# Patient Record
Sex: Male | Born: 1965 | Race: White | Hispanic: No | Marital: Married | Smoking: Current every day smoker
Health system: Southern US, Community
[De-identification: ages and names within clinical notes are randomized; demographics above are authoritative.]

## PROBLEM LIST (undated history)

## (undated) DIAGNOSIS — R319 Hematuria, unspecified: Secondary | ICD-10-CM

## (undated) DIAGNOSIS — Z8249 Family history of ischemic heart disease and other diseases of the circulatory system: Secondary | ICD-10-CM

## (undated) DIAGNOSIS — Z833 Family history of diabetes mellitus: Secondary | ICD-10-CM

## (undated) DIAGNOSIS — R030 Elevated blood-pressure reading, without diagnosis of hypertension: Secondary | ICD-10-CM

## (undated) DIAGNOSIS — Z720 Tobacco use: Secondary | ICD-10-CM

## (undated) DIAGNOSIS — L0591 Pilonidal cyst without abscess: Secondary | ICD-10-CM

## (undated) HISTORY — DX: Family history of diabetes mellitus: Z83.3

## (undated) HISTORY — DX: Hematuria, unspecified: R31.9

## (undated) HISTORY — DX: Family history of ischemic heart disease and other diseases of the circulatory system: Z82.49

## (undated) HISTORY — PX: TONSILLECTOMY AND ADENOIDECTOMY: SHX28

## (undated) HISTORY — DX: Tobacco use: Z72.0

## (undated) HISTORY — DX: Elevated blood-pressure reading, without diagnosis of hypertension: R03.0

---

## 2008-03-11 ENCOUNTER — Emergency Department (HOSPITAL_COMMUNITY): Admission: EM | Admit: 2008-03-11 | Discharge: 2008-03-11 | Payer: Self-pay | Admitting: Emergency Medicine

## 2008-11-02 ENCOUNTER — Emergency Department (HOSPITAL_COMMUNITY): Admission: EM | Admit: 2008-11-02 | Discharge: 2008-11-02 | Payer: Self-pay | Admitting: Emergency Medicine

## 2009-02-24 ENCOUNTER — Emergency Department (HOSPITAL_COMMUNITY): Admission: EM | Admit: 2009-02-24 | Discharge: 2009-02-24 | Payer: Self-pay | Admitting: Emergency Medicine

## 2009-05-24 ENCOUNTER — Emergency Department (HOSPITAL_COMMUNITY): Admission: EM | Admit: 2009-05-24 | Discharge: 2009-05-24 | Payer: Self-pay | Admitting: Family Medicine

## 2010-06-08 ENCOUNTER — Emergency Department (HOSPITAL_COMMUNITY): Admission: EM | Admit: 2010-06-08 | Discharge: 2010-06-08 | Payer: Self-pay | Admitting: Family Medicine

## 2012-01-09 ENCOUNTER — Encounter (HOSPITAL_COMMUNITY): Payer: Self-pay | Admitting: *Deleted

## 2012-01-09 ENCOUNTER — Emergency Department (INDEPENDENT_AMBULATORY_CARE_PROVIDER_SITE_OTHER)
Admission: EM | Admit: 2012-01-09 | Discharge: 2012-01-09 | Disposition: A | Discharge status: Home / Self Care | Payer: PRIVATE HEALTH INSURANCE | Source: Home / Self Care | Attending: Family Medicine | Admitting: Family Medicine

## 2012-01-09 DIAGNOSIS — K047 Periapical abscess without sinus: Secondary | ICD-10-CM

## 2012-01-09 MED ORDER — IBUPROFEN 600 MG PO TABS: 600.0000 mg | ORAL_TABLET | Freq: Three times a day (TID) | ORAL | Status: AC | PRN

## 2012-01-09 MED ORDER — CEFTRIAXONE SODIUM 1 G IJ SOLR
INTRAMUSCULAR | Status: AC
  Filled 2012-01-09: qty 10

## 2012-01-09 MED ORDER — CLINDAMYCIN HCL 300 MG PO CAPS: 300.0000 mg | ORAL_CAPSULE | Freq: Three times a day (TID) | ORAL | Status: AC

## 2012-01-09 MED ORDER — LIDOCAINE HCL (PF) 1 % IJ SOLN
INTRAMUSCULAR | Status: AC
  Filled 2012-01-09: qty 5

## 2012-01-09 MED ORDER — CEFTRIAXONE SODIUM 1 G IJ SOLR
1.0000 g | Freq: Once | INTRAMUSCULAR | Status: AC
  Administered 2012-01-09: 1 g via INTRAMUSCULAR

## 2012-01-09 MED ORDER — HYDROCODONE-ACETAMINOPHEN 5-500 MG PO TABS: 1.0000 | ORAL_TABLET | Freq: Four times a day (QID) | ORAL | Status: AC | PRN

## 2012-01-09 NOTE — ED Notes (Signed)
No adverse reaction to antibiotic injection

## 2012-01-09 NOTE — ED Provider Notes (Signed)
History     CSN: [id number]  Arrival date & time 01/09/12  0808   First MD Initiated Contact with Patient 01/09/12 0815      Chief Complaint  Patient presents with  . Dental Pain  . Facial Swelling  . Otalgia    (Consider location/radiation/quality/duration/timing/severity/associated sxs/prior treatment) HPI Comments: 46 y/o male no significant PMH here c/o dental pain right upper jaw associated with right lower face swelling since yesterday. Has a recent cold that is resolving still some persistent nasal congestion. Has had intermittent right ear pain  No drainage. No sore throat, cough or chest pain. Just got health insurance no dental insurance.    History reviewed. No pertinent past medical history.  History reviewed. No pertinent past surgical history.  Family History  Problem Relation Age of Onset  . Diabetes Mother   . Heart failure Mother     History  Substance Use Topics  . Smoking status: Current Everyday Smoker  . Smokeless tobacco: Not on file  . Alcohol Use: Yes      Review of Systems  Constitutional: Negative for fever, chills and appetite change.  HENT: Positive for ear pain, congestion, facial swelling and dental problem. Negative for neck stiffness and ear discharge.   Respiratory: Negative for cough and shortness of breath.   Gastrointestinal: Negative for nausea and vomiting.    Allergies  Review of patient's allergies indicates no known allergies.  Home Medications   Current Outpatient Rx  Name Route Sig Dispense Refill  . ADVIL PM PO Oral Take by mouth.    Marland Kitchen NAPROXEN SODIUM 220 MG PO TABS Oral Take 220 mg by mouth 2 (two) times daily with a meal.    . CLINDAMYCIN HCL 300 MG PO CAPS Oral Take 1 capsule (300 mg total) by mouth 3 (three) times daily. 21 capsule 0  . HYDROCODONE-ACETAMINOPHEN 5-500 MG PO TABS Oral Take 1-2 tablets by mouth every 6 (six) hours as needed for pain. 15 tablet 0  . IBUPROFEN 600 MG PO TABS Oral Take 1 tablet (600  mg total) by mouth every 8 (eight) hours as needed for pain. 20 tablet 0    BP 142/89  Pulse 97  Temp(Src) 98.6 F (37 C) (Oral)  Resp 18  SpO2 97%  Physical Exam  Nursing note and vitals reviewed. Constitutional: He is oriented to person, place, and time. He appears well-developed and well-nourished. No distress.  HENT:  Head: Normocephalic and atraumatic.  Right Ear: External ear normal.  Left Ear: External ear normal.  Mouth/Throat: Oropharynx is clear and moist. No oropharyngeal exudate.       Nasal congestion. No obvious rhinorrhea. Fractured molars in right upper jaw with red an exudative surrounded gum. Area is very tender and associated with mild swelling of the right upper side of face above the jaw.  Eyes: Conjunctivae and EOM are normal. Pupils are equal, round, and reactive to light. Right eye exhibits no discharge. Left eye exhibits no discharge.  Neck: Normal range of motion. Neck supple.  Cardiovascular: Normal rate, regular rhythm and normal heart sounds.   Pulmonary/Chest: Breath sounds normal.  Lymphadenopathy:    He has no cervical adenopathy.  Neurological: He is alert and oriented to person, place, and time.    ED Course  Procedures (including critical care time)  Labs Reviewed - No data to display No results found.   1. Dental abscess       MDM  Treated with rocephin 1g IM X1. Ibuprofen, Clindamycin, Vicodin.  Dentist follow up.        Sharin Grave, MD 01/10/12 2349

## 2012-01-09 NOTE — ED Notes (Signed)
Right upper dental pain - onset swelling this am - intermittent right ear pain -pt with recent cold symptoms remains with sinus congestion

## 2012-01-09 NOTE — ED Notes (Signed)
Rocephin injection given  Discharge delayed x 30 min

## 2012-03-18 ENCOUNTER — Encounter (HOSPITAL_COMMUNITY): Payer: Self-pay | Admitting: *Deleted

## 2012-03-18 ENCOUNTER — Emergency Department (INDEPENDENT_AMBULATORY_CARE_PROVIDER_SITE_OTHER)
Admission: EM | Admit: 2012-03-18 | Discharge: 2012-03-18 | Disposition: A | Discharge status: Home / Self Care | Payer: BC Managed Care – PPO | Source: Home / Self Care | Attending: Emergency Medicine | Admitting: Emergency Medicine

## 2012-03-18 DIAGNOSIS — L0291 Cutaneous abscess, unspecified: Secondary | ICD-10-CM

## 2012-03-18 DIAGNOSIS — L0390 Cellulitis, unspecified: Secondary | ICD-10-CM

## 2012-03-18 MED ORDER — CEPHALEXIN 500 MG PO CAPS: 500.0000 mg | ORAL_CAPSULE | Freq: Three times a day (TID) | ORAL | Status: AC

## 2012-03-18 MED ORDER — PREDNISONE 5 MG PO KIT: 1.0000 | PACK | Freq: Every day | ORAL | Status: DC

## 2012-03-18 NOTE — ED Notes (Signed)
Onset yesterday red, itchy, tingling rash on left lower leg

## 2012-03-18 NOTE — ED Provider Notes (Signed)
Chief Complaint  Patient presents with  . 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 Her since yesterday he's had a rash on his left ankle. This started out as a small red patch and has spread. It itches, burns, tingles, and is painful. He denies any skin rash elsewhere on his body. He's had no fever, chills, or respiratory symptoms. 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  Review of Systems:  Other than noted above, the patient denies any of the following symptoms: Systemic:  No fever, chills, sweats, weight loss, or fatigue. ENT:  No nasal congestion, rhinorrhea, sore throat, swelling of lips, tongue or throat. Resp:  No cough, wheezing, or shortness of breath. Skin:  No rash, itching, nodules, or suspicious lesions.  PMFSH:  Past medical history, family history, social history, meds, and allergies were reviewed.  Physical Exam:   Vital signs:  BP 147/93  Pulse 76  Temp(Src) 98.2 F (36.8 C) (Oral)  Resp 14  SpO2 98% Gen:  Alert, oriented, in no distress. Skin:  There is a 6 cm x 17 cm area of erythema, warmth, swelling, and slight tenderness on the left ankle, just above the malleolar. There was no evidence of a bite, no evidence for abscess, no purulence, no fluctuance, no drainage. This was not blistered. The skin was otherwise clear. The edges are raised and well-demarcated.  Assessment:   Diagnoses that have been ruled out:  None  Diagnoses that are still under consideration:  None  Final diagnoses:  Cellulitis - this is either an insect or spider bite or infection. It looks well-demarcated and there is nothing to culture today. Is also possible he could have gotten a bite that got secondarily infected.     Plan:   1.  The following meds were prescribed:   New Prescriptions   CEPHALEXIN (KEFLEX) 500 MG CAPSULE    Take 1  capsule (500 mg total) by mouth 3 (three) times daily.   PREDNISONE 5 MG KIT    Take 1 kit (5 mg total) by mouth daily after breakfast. Prednisone 5 mg 6 day dosepack.  Take as directed.   2.  The patient was instructed in symptomatic care and handouts were given. 3.  The patient was told to return if becoming worse in any way, if no better in 3 or 4 days, and given some red flag symptoms that would indicate earlier return.  Follow up:  The patient was told to follow up in 48 hours here, he is to keep the leg elevated and apply heat in the meantime. The area of erythema was traced with a skin marker and he was told to replace this if it should wear off. I suggested that he be on the lookout for high fevers or spreading of the erythema up the leg and this was the case to go directly to the emergency room for further evaluation.      Reuben Likes, MD 03/18/12 [phone]

## 2012-03-18 NOTE — Discharge Instructions (Signed)

## 2012-03-20 ENCOUNTER — Emergency Department (INDEPENDENT_AMBULATORY_CARE_PROVIDER_SITE_OTHER)
Admission: EM | Admit: 2012-03-20 | Discharge: 2012-03-20 | Disposition: A | Discharge status: Home / Self Care | Payer: BC Managed Care – PPO | Source: Home / Self Care | Attending: Emergency Medicine | Admitting: Emergency Medicine

## 2012-03-20 ENCOUNTER — Encounter (HOSPITAL_COMMUNITY): Payer: Self-pay | Admitting: Emergency Medicine

## 2012-03-20 DIAGNOSIS — L0291 Cutaneous abscess, unspecified: Secondary | ICD-10-CM

## 2012-03-20 DIAGNOSIS — L0390 Cellulitis, unspecified: Secondary | ICD-10-CM

## 2012-03-20 NOTE — Discharge Instructions (Signed)
Finish the antibiotics even if you feel bette.r return for any of the symptoms we discussed.

## 2012-03-20 NOTE — ED Notes (Signed)
PT HERE FOR F/U CELLULITIS TO LEFT LATERAL ANKLE  S/P PO KEFLEX AND PREDNISONE PRESCRIBED X 2 DYS AGO.PT DENIES PAIN OR FEVER.WOUND INTACT IN MARGIN AND DECREASE IN REDNESS.

## 2012-03-20 NOTE — ED Provider Notes (Signed)
History     CSN: [id number]  Arrival date & time 03/20/12  1059   First MD Initiated Contact with Patient 03/20/12 1122      Chief Complaint  Patient presents with  . Wound Check    (Consider location/radiation/quality/duration/timing/severity/associated sxs/prior treatment) HPI Comments: Patient was seen here 2 days ago for left lower extremity cellulitis. This was marked, and measured at 6 cm x 17 cm. patient was sent home with prednisone and Keflex, which she states that he is taking. Patient states that the redness has significantly improved, and the pain, swelling have resolved. No nausea, vomiting, fevers. No rash anywhere else.  Patient is a 46 y.o. male presenting with wound check. No language interpreter was used.  Wound Check  He was treated in the ED 2 to 3 days ago. Treatments since wound repair include oral antibiotics and regular soap and water washings. There has been no drainage from the wound. The redness has improved. The swelling has improved. The pain has improved. He has no difficulty moving the affected extremity or digit.    History reviewed. No pertinent past medical history.  History reviewed. No pertinent past surgical history.  Family History  Problem Relation Age of Onset  . Diabetes Mother   . Heart failure Mother     History  Substance Use Topics  . Smoking status: Current Everyday Smoker -- 0.5 packs/day for 25 years    Types: Cigarettes  . Smokeless tobacco: Not on file  . Alcohol Use: Yes      Review of Systems  Constitutional: Negative for fever.  Gastrointestinal: Negative for nausea and vomiting.  Musculoskeletal: Negative for myalgias.  Skin: Positive for color change. Negative for wound.    Allergies  Review of patient's allergies indicates no known allergies.  Home Medications   Current Outpatient Rx  Name Route Sig Dispense Refill  . CEPHALEXIN 500 MG PO CAPS Oral Take 1 capsule (500 mg total) by mouth 3 (three) times  daily. 30 capsule 0  . PREDNISONE 5 MG PO KIT Oral Take 1 kit (5 mg total) by mouth daily after breakfast. Prednisone 5 mg 6 day dosepack.  Take as directed. 1 kit 0  . ADVIL PM PO Oral Take by mouth.    Marland Kitchen NAPROXEN SODIUM 220 MG PO TABS Oral Take 220 mg by mouth 2 (two) times daily with a meal.      BP 151/83  Pulse 88  Temp(Src) 98.1 F (36.7 C) (Oral)  Resp 16  SpO2 96%  Physical Exam  Nursing note and vitals reviewed. Constitutional: He is oriented to person, place, and time. He appears well-developed and well-nourished.  HENT:  Head: Normocephalic and atraumatic.  Eyes: Conjunctivae and EOM are normal.  Neck: Normal range of motion.  Cardiovascular: Normal rate.   Pulmonary/Chest: Effort normal. No respiratory distress.  Abdominal: He exhibits no distension.  Musculoskeletal: Normal range of motion.       Left lower anterior extremity: Nontender erythema measuring approximately 4 cm x 15 cm. marked this with permanent marker. Skin intact.   Neurological: He is alert and oriented to person, place, and time.  Skin: Skin is warm and dry.  Psychiatric: He has a normal mood and affect. His behavior is normal.    ED Course  Procedures (including critical care time)  Labs Reviewed - No data to display No results found.   1. Cellulitis     MDM  Previous records reviewed. As noted in history of present illness. BP  noted.  Pt otherwise asxatic. Will have him f/u with PMD of choice for this  Luiz Blare, MD 03/20/12 1214

## 2014-06-04 ENCOUNTER — Emergency Department (HOSPITAL_COMMUNITY)
Admission: EM | Admit: 2014-06-04 | Discharge: 2014-06-04 | Disposition: A | Discharge status: Home / Self Care | Payer: 59 | Source: Home / Self Care

## 2014-06-04 ENCOUNTER — Encounter (HOSPITAL_COMMUNITY): Payer: Self-pay | Admitting: Emergency Medicine

## 2014-06-04 DIAGNOSIS — L0390 Cellulitis, unspecified: Secondary | ICD-10-CM

## 2014-06-04 DIAGNOSIS — L0291 Cutaneous abscess, unspecified: Secondary | ICD-10-CM

## 2014-06-04 DIAGNOSIS — K0520 Aggressive periodontitis, unspecified: Secondary | ICD-10-CM

## 2014-06-04 HISTORY — DX: Pilonidal cyst without abscess: L05.91

## 2014-06-04 MED ORDER — CHLORHEXIDINE GLUCONATE 0.12 % MT SOLN: 15.0000 mL | Freq: Two times a day (BID) | OROMUCOSAL | Status: DC | PRN

## 2014-06-04 MED ORDER — CLINDAMYCIN HCL 300 MG PO CAPS: 300.0000 mg | ORAL_CAPSULE | Freq: Three times a day (TID) | ORAL | Status: DC

## 2014-06-04 NOTE — ED Provider Notes (Signed)
CSN: [id number]     Arrival date & time 06/04/14  1028 History   None    Chief Complaint  Patient presents with  . Jaw Pain   (Consider location/radiation/quality/duration/timing/severity/associated sxs/prior Treatment) HPI L mandibular swelling: URI symptoms about 10 days ago. started 7 days ago. Initially improved but now getting worse. Significant enlargement overnight. Located in cheek. Denies tooth pain or oral discharge or bleeding. Ttp. 2 Aleve w/ improvement. Denies n/v/d/rash, fever, HA.    Past Medical History  Diagnosis Date  . Pilonidal cyst    History reviewed. No pertinent past surgical history. Family History  Problem Relation Age of Onset  . Diabetes Mother   . Heart failure Mother    History  Substance Use Topics  . Smoking status: Current Every Day Smoker -- 0.50 packs/day for 25 years    Types: Cigarettes  . Smokeless tobacco: Not on file  . Alcohol Use: Yes    Review of Systems  Constitutional:       Per HPI w/ all other systems neg    Allergies  Review of patient's allergies indicates no known allergies.  Home Medications   Prior to Admission medications   Medication Sig Start Date End Date Taking? Authorizing Provider  Ibuprofen-Diphenhydramine Cit (ADVIL PM PO) Take by mouth.   Yes Historical Provider, MD  chlorhexidine (PERIDEX) 0.12 % solution Use as directed 15 mLs in the mouth or throat 2 (two) times daily as needed. 06/04/14   Waldemar Dickens, MD  clindamycin (CLEOCIN) 300 MG capsule Take 1 capsule (300 mg total) by mouth 3 (three) times daily. 06/04/14   Waldemar Dickens, MD  naproxen sodium (ANAPROX) 220 MG tablet Take 220 mg by mouth 2 (two) times daily with a meal.    Historical Provider, MD  PredniSONE 5 MG KIT Take 1 kit (5 mg total) by mouth daily after breakfast. Prednisone 5 mg 6 day dosepack.  Take as directed. 03/18/12   Harden Mo, MD   BP 170/94  Pulse 60  Temp(Src) 98.7 F (37.1 C) (Oral)  Resp 16  SpO2 100% Physical Exam   Constitutional: He appears well-developed and well-nourished. No distress.  HENT:  L cheek swelling. No evidence of purulent discharge in mouth. Along the inside of the L cheek is TTp. No appreciable flucutance, L mandible w/ numerous cavities. Teeth nonttp, peridontal erythema and mild ttp. No pain w/ firm mastication w/ tongue depressors between teeth on L  Cardiovascular: Normal rate, normal heart sounds and intact distal pulses.   No murmur heard. Pulmonary/Chest: Effort normal and breath sounds normal.  Abdominal: Soft.  Musculoskeletal: Normal range of motion.  Neurological: He is alert.  Skin: Skin is warm. He is not diaphoretic.  Psychiatric: He has a normal mood and affect. His behavior is normal. Judgment and thought content normal.    ED Course  Procedures (including critical care time) Labs Review Labs Reviewed - No data to display  Imaging Review No results found.   MDM   1. Cellulitis   2. Periodontitis, acute    L cheek cellulitis: fortunately no evidence of abscess, mandibular, or tooth involvement. No systemic symptoms. Start Clinda for 14 days, warm compresses, PRN peridex, PRN ibuprofen. Pt to go to ED if symptoms return after stopping clinda Precautions given adn all questions answered  Linna Darner, MD Family Medicine PGY-3 06/04/2014, 11:50 AM      Waldemar Dickens, MD 06/04/14 1150

## 2014-06-04 NOTE — ED Provider Notes (Signed)
Medical screening examination/treatment/procedure(s) were performed by a resident physician and as supervising physician I was immediately available for consultation/collaboration.  Leslee Homeavid Saulo Anthis, M.D.  Reuben Likesavid C Meshell Abdulaziz, MD 06/04/14 [phone]

## 2014-06-04 NOTE — Discharge Instructions (Signed)
You have a soft tissue infection of the mouth.  Fortunately the bones and teeth do not seemto be involved I cannot appreciate an abscess at this time Please start the clindamycin for the infection. Take this for the full 14 days If your symptoms worsen or come back after the 14 days then go to the emergency room for more extensive treatments.  Use the Peridex from time to time to decrease the bacterial load in  Your mouth

## 2014-06-04 NOTE — ED Notes (Signed)
Patient complains of left lower jaw pain that has gotten worse over the period of one week; denies f/c.

## 2014-12-29 ENCOUNTER — Emergency Department (INDEPENDENT_AMBULATORY_CARE_PROVIDER_SITE_OTHER)
Admission: EM | Admit: 2014-12-29 | Discharge: 2014-12-29 | Disposition: A | Discharge status: Home / Self Care | Payer: 59 | Source: Home / Self Care | Attending: Family Medicine | Admitting: Family Medicine

## 2014-12-29 ENCOUNTER — Encounter (HOSPITAL_COMMUNITY): Payer: Self-pay | Admitting: Emergency Medicine

## 2014-12-29 DIAGNOSIS — L03211 Cellulitis of face: Secondary | ICD-10-CM

## 2014-12-29 DIAGNOSIS — K056 Periodontal disease, unspecified: Secondary | ICD-10-CM

## 2014-12-29 MED ORDER — LIDOCAINE HCL (PF) 1 % IJ SOLN
INTRAMUSCULAR | Status: AC
  Filled 2014-12-29: qty 5

## 2014-12-29 MED ORDER — CEFTRIAXONE SODIUM 1 G IJ SOLR
INTRAMUSCULAR | Status: AC
  Filled 2014-12-29: qty 10

## 2014-12-29 MED ORDER — CEFTRIAXONE SODIUM 1 G IJ SOLR
1.0000 g | Freq: Once | INTRAMUSCULAR | Status: AC
  Administered 2014-12-29: 1 g via INTRAMUSCULAR

## 2014-12-29 MED ORDER — CHLORHEXIDINE GLUCONATE 0.12 % MT SOLN: 15.0000 mL | Freq: Two times a day (BID) | OROMUCOSAL | Status: DC | PRN

## 2014-12-29 MED ORDER — CLINDAMYCIN HCL 300 MG PO CAPS: 300.0000 mg | ORAL_CAPSULE | Freq: Three times a day (TID) | ORAL | Status: DC

## 2014-12-29 NOTE — ED Provider Notes (Signed)
Duane Torres is a 49 y.o. male who presents to Urgent Care today for left facial cellulitis. Patient was feeling poorly yesterday and woke up this morning with pain and swelling along the left side of his face. He denies any discharge. This happened previously was thought to be due to. I will disease. He has not tried any medications yet. No vomiting diarrhea fevers or chills.   Past Medical History  Diagnosis Date  . Pilonidal cyst    History reviewed. No pertinent past surgical history. History  Substance Use Topics  . Smoking status: Current Every Day Smoker -- 0.50 packs/day for 25 years    Types: Cigarettes  . Smokeless tobacco: Not on file  . Alcohol Use: Yes   ROS as above Medications: Current Facility-Administered Medications  Medication Dose Route Frequency Provider Last Rate Last Dose  . cefTRIAXone (ROCEPHIN) injection 1 g  1 g Intramuscular Once Rodolph BongEvan S Lyndsey Demos, MD       Current Outpatient Prescriptions  Medication Sig Dispense Refill  . chlorhexidine (PERIDEX) 0.12 % solution Use as directed 15 mLs in the mouth or throat 2 (two) times daily as needed. 120 mL 0  . clindamycin (CLEOCIN) 300 MG capsule Take 1 capsule (300 mg total) by mouth 3 (three) times daily. 42 capsule 0  . [DISCONTINUED] Ibuprofen-Diphenhydramine Cit (ADVIL PM PO) Take by mouth.     No Known Allergies   Exam:  BP 159/89 mmHg  Pulse 91  Temp(Src) 99 F (37.2 C) (Oral)  Resp 18  SpO2 97% Gen: Well NAD HEENT: EOMI,  MMM left facial erythema and tenderness. Area Donald disease throughout. No nasal discharge. Lungs: Normal work of breathing. CTABL Heart: RRR no MRG Abd: NABS, Soft. Nondistended, Nontender Exts: Brisk capillary refill, warm and well perfused.   Patient was given a 1 g IM ceftriaxone injection prior to discharge  No results found for this or any previous visit (from the past 24 hour(s)). No results found.  Assessment and Plan: 49 y.o. male with left-sided facial cellulitis  due to periodontal disease. Ceftriaxone as above followed by clindamycin and chlorhexidine mouthwash solution. Follow up with a dentist. Go to the emergency room if worsening.  Discussed warning signs or symptoms. Please see discharge instructions. Patient expresses understanding.     Rodolph BongEvan S Leonie Amacher, MD 12/29/14 1102

## 2014-12-29 NOTE — Discharge Instructions (Signed)
Thank you for coming in today. Take clindamycin daily as directed Use mouthwash as directed Follow-up with a dentist If you get worse go to the emergency room for IV antibiotics  [facility]:  [facility] [phone] (ext [phone])  [phone]  Please call Dr. Lawrence Marseillesivils office [phone] or cell [phone], [place]  Cost for tooth removal $200 includes exam, Xray, and extraction and follow up visit.  Bring list of current medications with you.   [facility] - [phone]  [facility] [phone]  [address] [facility] [address]  [facility]  [address], [place], [place], [id number]  [phone], Ext. [phone]  2nd and 4th Thursday of the month at 6:30am (Simple extractions only - no wisdom teeth or surgery) First come/First serve -First 10 clients served  [facility] [place] residents only)  [address], [place], [place], [address]  336 [phone]  [facility]  [phone]  [facility]  [phone]  [facility]  [phone]  Please call [facility] at [phone] to get the details to get your tooth pulled.     Cellulitis Cellulitis is an infection of the skin and the tissue beneath it. The infected area is usually red and tender. Cellulitis occurs most often in the arms and lower legs.  CAUSES  Cellulitis is caused by bacteria that enter the skin through cracks or cuts in the skin. The most common types of bacteria that cause cellulitis are staphylococci and streptococci. SIGNS AND SYMPTOMS   Redness and warmth.  Swelling.  Tenderness or pain.  Fever. DIAGNOSIS  Your health care provider can usually determine what is wrong based on a physical exam. Blood tests may also be done. TREATMENT  Treatment usually involves taking an antibiotic  medicine. HOME CARE INSTRUCTIONS   Take your antibiotic medicine as directed by your health care provider. Finish the antibiotic even if you start to feel better.  Keep the infected arm or leg elevated to reduce swelling.  Apply a warm cloth to the affected area up to 4 times per day to relieve pain.  Take medicines only as directed by your health care provider.  Keep all follow-up visits as directed by your health care provider. SEEK MEDICAL CARE IF:   You notice red streaks coming from the infected area.  Your red area gets larger or turns dark in color.  Your bone or joint underneath the infected area becomes painful after the skin has healed.  Your infection returns in the same area or another area.  You notice a swollen bump in the infected area.  You develop new symptoms.  You have a fever. SEEK IMMEDIATE MEDICAL CARE IF:   You feel very sleepy.  You develop vomiting or diarrhea.  You have a general ill feeling (malaise) with muscle aches and pains. MAKE SURE YOU:   Understand these instructions.  Will watch your condition.  Will get help right away if you are not doing well or get worse. Document Released: 09/19/2005 Document Revised: 04/26/2014 Document Reviewed: 02/25/2012 [facility] Patient Information 2015 [employer] [place]. This information is not intended to replace advice given to you by your health care provider. Make sure you discuss any questions you have with your health care provider.

## 2014-12-29 NOTE — ED Notes (Signed)
C/o left side facial swelling onset last night Sx include "sluggish", n/v, localized fever, and sore Denies fevers Alert, no signs of acute distress.

## 2015-09-14 ENCOUNTER — Other Ambulatory Visit: Payer: Self-pay | Admitting: Physician Assistant

## 2015-09-14 ENCOUNTER — Other Ambulatory Visit: Payer: Self-pay | Admitting: Nurse Practitioner

## 2015-09-14 ENCOUNTER — Ambulatory Visit (INDEPENDENT_AMBULATORY_CARE_PROVIDER_SITE_OTHER): Payer: Commercial Managed Care - HMO

## 2015-09-14 ENCOUNTER — Encounter: Payer: Commercial Managed Care - HMO | Admitting: Nurse Practitioner

## 2015-09-14 DIAGNOSIS — R319 Hematuria, unspecified: Secondary | ICD-10-CM | POA: Insufficient documentation

## 2015-09-14 DIAGNOSIS — Z8249 Family history of ischemic heart disease and other diseases of the circulatory system: Secondary | ICD-10-CM

## 2015-09-14 DIAGNOSIS — Z833 Family history of diabetes mellitus: Secondary | ICD-10-CM | POA: Insufficient documentation

## 2015-09-14 DIAGNOSIS — L0591 Pilonidal cyst without abscess: Secondary | ICD-10-CM | POA: Insufficient documentation

## 2015-09-14 DIAGNOSIS — R030 Elevated blood-pressure reading, without diagnosis of hypertension: Secondary | ICD-10-CM | POA: Insufficient documentation

## 2015-09-14 DIAGNOSIS — Z720 Tobacco use: Secondary | ICD-10-CM | POA: Insufficient documentation

## 2015-09-14 LAB — EXERCISE TOLERANCE TEST
CHL CUP MPHR: 145 {beats}/min
CHL CUP RESTING HR STRESS: 75 {beats}/min
CHL RATE OF PERCEIVED EXERTION: 14
CSEPED: 5 min
CSEPEDS: 19 s
CSEPEW: 7 METS
Peak HR: 148 {beats}/min
Percent HR: 87 %

## 2015-09-14 MED ORDER — LISINOPRIL 10 MG PO TABS: 10.0000 mg | ORAL_TABLET | Freq: Every day | ORAL | Status: DC

## 2016-01-21 ENCOUNTER — Other Ambulatory Visit: Payer: Self-pay | Admitting: Nurse Practitioner

## 2016-01-23 NOTE — Telephone Encounter (Signed)
Pharmacy requesting refill on Lisinopril. I cannot find where the pt has ever been seen by our providers. Last refill sent in by Norma Fredrickson, NP on 09/14/15. Please advise.

## 2016-01-25 ENCOUNTER — Other Ambulatory Visit: Payer: Self-pay | Admitting: Nurse Practitioner

## 2016-01-25 NOTE — Telephone Encounter (Signed)
Spoke to patient he stated he already had lisinopril refilled with PCP.

## 2016-01-25 NOTE — Telephone Encounter (Signed)
Patient called no answer.LMTC about refilling lisinopril.

## 2017-01-12 ENCOUNTER — Encounter (HOSPITAL_COMMUNITY): Payer: Self-pay | Admitting: *Deleted

## 2017-01-12 ENCOUNTER — Ambulatory Visit (HOSPITAL_COMMUNITY)
Admission: EM | Admit: 2017-01-12 | Discharge: 2017-01-12 | Disposition: A | Discharge status: Home / Self Care | Payer: No Typology Code available for payment source | Attending: Family Medicine | Admitting: Family Medicine

## 2017-01-12 DIAGNOSIS — L03116 Cellulitis of left lower limb: Secondary | ICD-10-CM

## 2017-01-12 MED ORDER — DOXYCYCLINE HYCLATE 100 MG PO TABS: 100.0000 mg | ORAL_TABLET | Freq: Two times a day (BID) | ORAL | 0 refills | Status: DC

## 2017-01-12 NOTE — ED Triage Notes (Signed)
Pt  Reports      He  May   Bumped  His  l  Lower  Leg      Now  Has  Pain   Warmth       And   Swelling  To  The   Affected  Leg            Tender  To  The  Touch

## 2017-01-12 NOTE — ED Provider Notes (Signed)
[facility]    CSN: [id number] Arrival date & time: 01/12/17  1213     History   Chief Complaint Chief Complaint  Patient presents with  . Leg Swelling    HPI Briar Witherspoon is a 51 y.o. male.   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 He thinks he bumped it but didn't bother doing anything about it until about 2 days ago when he noticed increasing swelling. There is a deep ache but is not to sore and he does have some tenderness over the midportion of his left tibia.  He's had no drainage or fever. He does feel like the swelling is gotten worse overnight.      Past Medical History:  Diagnosis Date  . Elevated blood pressure reading without diagnosis of hypertension   . Family history of cardiovascular disease   . Family history of diabetes mellitus   . Hematuria   . Pilonidal cyst   . Tobacco abuse     Patient Active Problem List   Diagnosis Date Noted  . Pilonidal cyst   . Tobacco abuse   . Elevated blood pressure reading without diagnosis of hypertension   . Hematuria   . Family history of diabetes mellitus   . Family history of cardiovascular disease     Past Surgical History:  Procedure Laterality Date  . TONSILLECTOMY AND ADENOIDECTOMY         Home Medications    Prior to Admission medications   Medication Sig Start Date End Date Taking? Authorizing Provider  doxycycline (VIBRA-TABS) 100 MG tablet Take 1 tablet (100 mg total) by mouth 2 (two) times daily. 01/12/17   Elvina Sidle, MD  lisinopril (PRINIVIL,ZESTRIL) 10 MG tablet Take 1 tablet (10 mg total) by mouth daily. 09/14/15   Rosalio Macadamia, NP    Family History Family History  Problem Relation Age of Onset  . Diabetes Mother   . Heart failure Mother   . Hypertension Mother   . Stroke Mother   . Breast cancer Mother   . Diabetes Father   . Hypertension Father   . Heart attack Brother    . Diabetes Brother     Social History Social History  Substance Use Topics  . Smoking status: Current Every Day Smoker    Packs/day: 0.50    Years: 25.00    Types: Cigarettes  . Smokeless tobacco: Not on file  . Alcohol use 0.0 oz/week     Allergies   Patient has no known allergies.   Review of Systems Review of Systems  Constitutional: Negative.   HENT: Negative.   Eyes: Negative.   Respiratory: Negative.   Cardiovascular: Negative.   Skin: Positive for wound.     Physical Exam Triage Vital Signs ED Triage Vitals  Enc Vitals Group     BP 01/12/17 1341 150/100     Pulse Rate 01/12/17 1341 72     Resp 01/12/17 1341 18     Temp 01/12/17 1341 98.6 F (37 C)     Temp Source 01/12/17 1341 Oral     SpO2 01/12/17 1341 100 %     Weight --      Height --      Head Circumference --      Peak Flow --      Pain Score 01/12/17 1343 4     Pain Loc --  Pain Edu? --      Excl. in GC? --    No data found.   Updated Vital Signs BP 150/100 (BP Location: Left Arm)   Pulse 72   Temp 98.6 F (37 C) (Oral)   Resp 18   SpO2 100%   Physical Exam  Constitutional: He is oriented to person, place, and time. He appears well-developed and well-nourished.  HENT:  Right Ear: External ear normal.  Left Ear: External ear normal.  Mouth/Throat: Oropharynx is clear and moist.  Eyes: Conjunctivae and EOM are normal.  Neck: Normal range of motion. Neck supple.  Pulmonary/Chest: Effort normal.  Musculoskeletal: Normal range of motion.  Neurological: He is alert and oriented to person, place, and time.  Skin:  There is mild swelling over the anterior shin of the left leg with some fluctuance. There is overlying abrasion which is dry.  Nursing note and vitals reviewed.    UC Treatments / Results  Labs (all labs ordered are listed, but only abnormal results are displayed) Labs Reviewed - No data to display  EKG  EKG Interpretation None       Radiology No  results found.  Procedures Procedures (including critical care time)  Medications Ordered in UC Medications - No data to display   Initial Impression / Assessment and Plan / UC Course  I have reviewed the triage vital signs and the nursing notes.  Pertinent labs & imaging results that were available during my care of the patient were reviewed by me and considered in my medical decision making (see chart for details).     Final Clinical Impressions(s) / UC Diagnoses   Final diagnoses:  Cellulitis of left lower extremity    New Prescriptions New Prescriptions   DOXYCYCLINE (VIBRA-TABS) 100 MG TABLET    Take 1 tablet (100 mg total) by mouth 2 (two) times daily.     Elvina SidleKurt Genni Buske, MD 01/12/17 [id number]

## 2017-08-15 ENCOUNTER — Encounter (HOSPITAL_COMMUNITY): Payer: Self-pay | Admitting: Emergency Medicine

## 2017-08-15 ENCOUNTER — Ambulatory Visit (HOSPITAL_COMMUNITY)
Admission: EM | Admit: 2017-08-15 | Discharge: 2017-08-15 | Disposition: A | Discharge status: Home / Self Care | Payer: No Typology Code available for payment source | Attending: Emergency Medicine | Admitting: Emergency Medicine

## 2017-08-15 DIAGNOSIS — H10232 Serous conjunctivitis, except viral, left eye: Secondary | ICD-10-CM | POA: Diagnosis not present

## 2017-08-15 MED ORDER — POLYMYXIN B-TRIMETHOPRIM 10000-0.1 UNIT/ML-% OP SOLN: 2.0000 [drp] | OPHTHALMIC | 0 refills | Status: DC

## 2017-08-15 MED ORDER — TETRACAINE HCL 0.5 % OP SOLN
OPHTHALMIC | Status: AC
  Filled 2017-08-15: qty 4

## 2017-08-15 NOTE — ED Provider Notes (Signed)
  [facility]   [id number] 08/15/17 Arrival Time: 1117  ASSESSMENT & PLAN:  1. Serous conjunctivitis of left eye     Meds ordered this encounter  Medications  . trimethoprim-polymyxin b (POLYTRIM) ophthalmic solution    Sig: Place 2 drops into the left eye every 4 (four) hours.    Dispense:  10 mL    Refill:  0    Order Specific Question:   Supervising Provider    Answer:   Domenick Gong [[id number]]    Reviewed expectations re: course of current medical issues. Questions answered. Outlined signs and symptoms indicating need for more acute intervention. Patient verbalized understanding. After Visit Summary given.   SUBJECTIVE:  Duane Torres is a 51 y.o. male who presents with complaint of   ROS: As per HPI.   OBJECTIVE:  Vitals:   08/15/17 1135 08/15/17 1136  BP: (!) 178/125 (!) 146/102  Pulse: 74   Resp: 16   Temp: 98.6 F (37 C)   TempSrc: Oral   SpO2: 100%   Weight:  187 lb (84.8 kg)  Height:  5\' 10"  (1.778 m)     General appearance: alert; no distress Eyes: PERRLA; EOMI; conjunctiva erythematous and swollen OS HENT: normocephalic; atraumatic; TMs normal; nasal mucosa normal; oral mucosa normal Lungs: clear to auscultation bilaterally Heart: regular rate and rhythm  Past Medical History:  Diagnosis Date  . Elevated blood pressure reading without diagnosis of hypertension   . Family history of cardiovascular disease   . Family history of diabetes mellitus   . Hematuria   . Pilonidal cyst   . Tobacco abuse      has a past medical history of Elevated blood pressure reading without diagnosis of hypertension; Family history of cardiovascular disease; Family history of diabetes mellitus; Hematuria; Pilonidal cyst; and Tobacco abuse.  Results for orders placed or performed in visit on 09/14/15  Exercise Tolerance Test  Result Value Ref Range   Rest HR 75 bpm   Rest BP 166/99 mmHg   Exercise duration (min) 5 min   Exercise duration (sec) 19  sec   Estimated workload 7.0 METS   Peak HR 148 bpm   Peak BP 224/107 mmHg   MPHR 145 bpm   Percent HR 87 %   RPE 14     Labs Reviewed - No data to display  Imaging: No results found.  No Known Allergies  Family History  Problem Relation Age of Onset  . Diabetes Mother   . Heart failure Mother   . Hypertension Mother   . Stroke Mother   . Breast cancer Mother   . Diabetes Father   . Hypertension Father   . Heart attack Brother   . Diabetes Brother    Past Surgical History:  Procedure Laterality Date  . TONSILLECTOMY AND ADENOIDECTOMY           Deatra Canter, FNP 08/15/17 1229

## 2017-08-15 NOTE — ED Triage Notes (Signed)
Patient in the process of moving.  Last Friday 8/17, noticed redness in lateral corner of left eye.  Slight soreness over weekend, otherwise seemed to improve.  Has been using visine eye drops.  Patient did a lot of moving of items and dusting on Sunday.  Since Monday, left eye has redder, almost swollen shut this morning and soreness has worsened.  No known injury.

## 2017-08-19 ENCOUNTER — Encounter (HOSPITAL_COMMUNITY): Payer: Self-pay | Admitting: Emergency Medicine

## 2017-08-19 ENCOUNTER — Ambulatory Visit (HOSPITAL_COMMUNITY)
Admission: EM | Admit: 2017-08-19 | Discharge: 2017-08-19 | Disposition: A | Discharge status: Home / Self Care | Payer: No Typology Code available for payment source | Attending: Family Medicine | Admitting: Family Medicine

## 2017-08-19 DIAGNOSIS — H15101 Unspecified episcleritis, right eye: Secondary | ICD-10-CM | POA: Diagnosis not present

## 2017-08-19 NOTE — ED Provider Notes (Signed)
  [facility]   [id number] 08/19/17 Arrival Time: 1128  ASSESSMENT & PLAN:  1. Episcleritis of right eye    Discussed episcleritis/scleritis and the importance of seeing an opthhalmologist. He prefers to self-schedule to be seen within the next 24 hours. Will let us know if he is having any trouble scheduling. He may stop Polytrim gtts and use OTC saline gtts if needed.  Reviewed expectations re: course of current medical issues. Questions answered. Outlined signs and symptoms indicating need for more acute intervention. Patient verbalized understanding. After Visit Summary given.   SUBJECTIVE:  Duane Torres is a 51 y.o. male who presents with complaint of worsening redness of left eye. Seen here on 8/23 and diagnosed with conjunctivitis; started on Polytrim. He has been using BID. For the pst 24-48 hours reports increasing erythema and discomfort of left eye. "Blurry" vision. No light sensitivity. He does not wear contact lenses. No history of similar eye problem.  ROS: As per HPI.   OBJECTIVE:  Vitals:   08/19/17 1220 08/19/17 1221  BP:  135/90  Pulse:  80  Resp:  16  Temp:  98.7 F (37.1 C)  TempSrc:  Oral  SpO2:  99%  Weight: 187 lb (84.8 kg)   Height: 5\' 10"  (1.778 m)     Vision: OS 20/20 OD 20/30  General appearance: alert; no distress Eyes: PERRLA; EOMI; scleral injection and inflammation lateral OS; cornea appears grossly normal; no ciliary flush Neck: supple Skin: warm and dry Psychological: alert and cooperative; normal mood and affect  Past Medical History:  Diagnosis Date  . Elevated blood pressure reading without diagnosis of hypertension   . Family history of cardiovascular disease   . Family history of diabetes mellitus   . Hematuria   . Pilonidal cyst   . Tobacco abuse     No Known Allergies  Family History  Problem Relation Age of Onset  . Diabetes Mother   . Heart failure Mother   . Hypertension Mother   . Stroke Mother     . Breast cancer Mother   . Diabetes Father   . Hypertension Father   . Heart attack Brother   . Diabetes Brother    Past Surgical History:  Procedure Laterality Date  . TONSILLECTOMY AND ADENOIDECTOMY           Mardella Layman, MD 08/19/17 1330

## 2017-08-19 NOTE — ED Triage Notes (Signed)
PT reports left eye irritation, pain, redness for over a week. PT reports left eye vision is now blurry as of this morning. PT was seen here Thursday and is taking prescribed eye drops. Condition is not improving.

## 2018-08-27 ENCOUNTER — Encounter (HOSPITAL_COMMUNITY): Payer: Self-pay | Admitting: Emergency Medicine

## 2018-08-27 ENCOUNTER — Ambulatory Visit (HOSPITAL_COMMUNITY)
Admission: EM | Admit: 2018-08-27 | Discharge: 2018-08-27 | Disposition: A | Discharge status: Home / Self Care | Payer: No Typology Code available for payment source | Attending: Family Medicine | Admitting: Family Medicine

## 2018-08-27 DIAGNOSIS — L538 Other specified erythematous conditions: Secondary | ICD-10-CM

## 2018-08-27 DIAGNOSIS — M25571 Pain in right ankle and joints of right foot: Secondary | ICD-10-CM

## 2018-08-27 DIAGNOSIS — T7840XA Allergy, unspecified, initial encounter: Secondary | ICD-10-CM

## 2018-08-27 MED ORDER — TRIAMCINOLONE ACETONIDE 0.1 % EX CREA: 1.0000 "application " | TOPICAL_CREAM | Freq: Two times a day (BID) | CUTANEOUS | 0 refills | Status: DC

## 2018-08-27 NOTE — ED Provider Notes (Signed)
[facility]    CSN: [id number] Arrival date & time: 08/27/18  1121     History   Chief Complaint Chief Complaint  Patient presents with  . Ankle Pain    HPI Duane Torres is a 52 y.o. male.   Patient is a 52 year old male with past medical history of cellulitis.  He presents with 1 day of right medial ankle pain and swelling with erythema. Some itching. 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  Due to patient's past medical history of cellulitis he was concerned that this was an infection.  He circled the area of redness.  Since last night the redness, swelling and tenderness has significantly improved.  There is still some mild erythema and swelling.  He denies any fever, chills, body aches, night sweats, fatigue.  Patient showed me a picture of ankle last night and there was significant improvement.   He is a current every day smoker.  Past family medical history of cardiovascular disease, diabetes.  ROS per HPI      Past Medical History:  Diagnosis Date  . Elevated blood pressure reading without diagnosis of hypertension   . Family history of cardiovascular disease   . Family history of diabetes mellitus   . Hematuria   . Pilonidal cyst   . Tobacco abuse     Patient Active Problem List   Diagnosis Date Noted  . Pilonidal cyst   . Tobacco abuse   . Elevated blood pressure reading without diagnosis of hypertension   . Hematuria   . Family history of diabetes mellitus   . Family history of cardiovascular disease     Past Surgical History:  Procedure Laterality Date  . TONSILLECTOMY AND ADENOIDECTOMY         Home Medications    Prior to Admission medications   Medication Sig Start Date End Date Taking? Authorizing Provider  doxycycline (VIBRA-TABS) 100 MG tablet Take 1 tablet (100 mg total) by mouth 2 (two) times daily. Patient not  taking: Reported on 08/27/2018 01/12/17   Elvina Sidle, MD  lisinopril (PRINIVIL,ZESTRIL) 10 MG tablet Take 1 tablet (10 mg total) by mouth daily. 09/14/15   Rosalio Macadamia, NP  triamcinolone cream (KENALOG) 0.1 % Apply 1 application topically 2 (two) times daily. 08/27/18   Dahlia Byes A, NP  trimethoprim-polymyxin b (POLYTRIM) ophthalmic solution Place 2 drops into the left eye every 4 (four) hours. 08/15/17   Deatra Canter, FNP    Family History Family History  Problem Relation Age of Onset  . Diabetes Mother   . Heart failure Mother   . Hypertension Mother   . Stroke Mother   . Breast cancer Mother   . Diabetes Father   . Hypertension Father   . Heart attack Brother   . Diabetes Brother     Social History Social History   Tobacco Use  . Smoking status: Current Every Day Smoker    Packs/day: 0.50    Years: 25.00    Pack years: 12.50    Types: Cigarettes  Substance Use Topics  . Alcohol use: Yes    Alcohol/week: 0.0 standard drinks  . Drug use: No    Comment: USED IN THE PASS A FEW TIMES     Allergies   Patient has no known allergies.   Review of Systems Review of Systems   Physical Exam Triage Vital  Signs ED Triage Vitals  Enc Vitals Group     BP      Pulse      Resp      Temp      Temp src      SpO2      Weight      Height      Head Circumference      Peak Flow      Pain Score      Pain Loc      Pain Edu?      Excl. in GC?    No data found.  Updated Vital Signs There were no vitals taken for this visit.  Visual Acuity Right Eye Distance:   Left Eye Distance:   Bilateral Distance:    Right Eye Near:   Left Eye Near:    Bilateral Near:     Physical Exam  Constitutional: He is oriented to person, place, and time. He appears well-developed and well-nourished.  Very pleasant. Non toxic or ill appearing.     HENT:  Head: Normocephalic and atraumatic.  Eyes: Conjunctivae are normal.  Neck: Normal range of motion.  Pulmonary/Chest:  Effort normal.  Musculoskeletal: Normal range of motion.  Neurological: He is alert and oriented to person, place, and time.  Skin: Skin is warm and dry.  Very mild erythema and minimal swelling to right lateral malleolus area.  Sensation and pedal pulse intact.  No open wounds or drainage  Psychiatric: He has a normal mood and affect.  Nursing note and vitals reviewed.    UC Treatments / Results  Labs (all labs ordered are listed, but only abnormal results are displayed) Labs Reviewed - No data to display  EKG None  Radiology No results found.  Procedures Procedures (including critical care time)  Medications Ordered in UC Medications - No data to display  Initial Impression / Assessment and Plan / UC Course  I have reviewed the triage vital signs and the nursing notes.  Pertinent labs & imaging results that were available during my care of the patient were reviewed by me and considered in my medical decision making (see chart for details).     Most likely inflammatory response to some sort of insect bite versus cellulitis. The redness, swelling has significantly improved since yesterday Instructed patient to monitor for worsening symptoms Will prescribe triamcinolone cream for inflammation. Follow-up as needed Final Clinical Impressions(s) / UC Diagnoses   Final diagnoses:  Allergic reaction, initial encounter     Discharge Instructions     It was nice meeting you!!  We will try some steroid cream to the area 2 times a day.  Keep a close watch of the area and monitor for worsening symptoms. Please follow-up if no improvement.    ED Prescriptions    Medication Sig Dispense Auth. Provider   triamcinolone cream (KENALOG) 0.1 % Apply 1 application topically 2 (two) times daily. 30 g Dahlia Byes A, NP     Controlled Substance Prescriptions [place] Controlled Substance Registry consulted? Not Applicable   Janace Aris, NP 08/27/18 1301

## 2018-08-27 NOTE — Discharge Instructions (Signed)
It was nice meeting you!!  We will try some steroid cream to the area 2 times a day.  Keep a close watch of the area and monitor for worsening symptoms. Please follow-up if no improvement.

## 2018-08-27 NOTE — ED Triage Notes (Signed)
Pt here with swelling to right ankle after removing shoes; pt sts hx of cellulitis and wanted to have checked; pt sts some improvement but still having swelling and pain

## 2019-01-14 ENCOUNTER — Ambulatory Visit (HOSPITAL_COMMUNITY)
Admission: EM | Admit: 2019-01-14 | Discharge: 2019-01-14 | Disposition: A | Discharge status: Home / Self Care | Payer: No Typology Code available for payment source | Attending: Family Medicine | Admitting: Family Medicine

## 2019-01-14 ENCOUNTER — Ambulatory Visit (INDEPENDENT_AMBULATORY_CARE_PROVIDER_SITE_OTHER): Payer: No Typology Code available for payment source

## 2019-01-14 ENCOUNTER — Encounter (HOSPITAL_COMMUNITY): Payer: Self-pay | Admitting: Emergency Medicine

## 2019-01-14 DIAGNOSIS — M654 Radial styloid tenosynovitis [de Quervain]: Secondary | ICD-10-CM

## 2019-01-14 DIAGNOSIS — M25531 Pain in right wrist: Secondary | ICD-10-CM

## 2019-01-14 MED ORDER — NAPROXEN 375 MG PO TABS: 375.0000 mg | ORAL_TABLET | Freq: Two times a day (BID) | ORAL | 0 refills | Status: DC

## 2019-01-14 NOTE — ED Notes (Signed)
Patient able to ambulate independently  

## 2019-01-14 NOTE — Discharge Instructions (Addendum)
X-rays did not show show fracture or dislocation Thumb spica place.  Continue conservative management of rest, ice, elevation, and compresion Take naproxen as needed for pain relief (may cause abdominal discomfort, ulcers, and GI bleeds avoid taking with other NSAIDs) Work restrictions given Follow up with PCP or with orthopedist if symptoms persist Return or go to the ER if you have any new or worsening symptoms (fever, chills, chest pain, abdominal pain, changes in bowel or bladder habits, pain radiating into lower legs, etc...)

## 2019-01-14 NOTE — ED Provider Notes (Signed)
[facility]   [id number] 01/14/19 Arrival Time: [id number]  CC: Right wrist pain  SUBJECTIVE: History from: patient. Duane Torres is a 53 y.o. male complains of right wrist pain that began 1 day ago.  Denies a precipitating event or specific injury, but admits to repetitive movements at work.  Works at [employer].  Localizes the pain to the lateral wrist.  Describes the pain as intermittent and sharp and radiates towards his elbow.  Has NOT tried OTC medications.  Symptoms are made worse with wrist ROM.  Denies similar symptoms in the past.  Hx significant for cellulitis. Complains of associated swelling. Denies fever, chills, nausea, vomiting, erythema, ecchymosis, weakness, numbness and tingling.      ROS: As per HPI.  Past Medical History:  Diagnosis Date  . Elevated blood pressure reading without diagnosis of hypertension   . Family history of cardiovascular disease   . Family history of diabetes mellitus   . Hematuria   . Pilonidal cyst   . Tobacco abuse    Past Surgical History:  Procedure Laterality Date  . TONSILLECTOMY AND ADENOIDECTOMY     No Known Allergies No current facility-administered medications on file prior to encounter.    Current Outpatient Medications on File Prior to Encounter  Medication Sig Dispense Refill  . lisinopril (PRINIVIL,ZESTRIL) 10 MG tablet Take 1 tablet (10 mg total) by mouth daily. 30 tablet 3   Social History   Socioeconomic History  . Marital status: Single    Spouse name: Not on file  . Number of children: 0  . Years of education: Not on file  . Highest education level: Not on file  Occupational History  . Occupation: [employer]  Social Needs  . Financial resource strain: Not on file  . Food insecurity:    Worry: Not on file    Inability: Not on file  . Transportation needs:    Medical: Not on file    Non-medical: Not on file  Tobacco Use  . Smoking status: Current Every Day Smoker    Packs/day: 0.50   Years: 25.00    Pack years: 12.50    Types: Cigarettes  Substance and Sexual Activity  . Alcohol use: Yes    Alcohol/week: 0.0 standard drinks  . Drug use: No    Comment: USED IN THE PASS A FEW TIMES  . Sexual activity: Yes  Lifestyle  . Physical activity:    Days per week: Not on file    Minutes per session: Not on file  . Stress: Not on file  Relationships  . Social connections:    Talks on phone: Not on file    Gets together: Not on file    Attends religious service: Not on file    Active member of club or organization: Not on file    Attends meetings of clubs or organizations: Not on file    Relationship status: Not on file  . Intimate partner violence:    Fear of current or ex partner: Not on file    Emotionally abused: Not on file    Physically abused: Not on file    Forced sexual activity: Not on file  Other Topics Concern  . Not on file  Social History Narrative  . Not on file   Family History  Problem Relation Age of Onset  . Diabetes Mother   . Heart failure Mother   . Hypertension Mother   . Stroke Mother   . Breast cancer Mother   .  Diabetes Father   . Hypertension Father   . Heart attack Brother   . Diabetes Brother     OBJECTIVE:  Vitals:   01/14/19 1107  BP: (!) 147/83  Pulse: 63  Resp: 18  Temp: 98.7 F (37.1 C)  TempSrc: Temporal  SpO2: 100%    General appearance: AOx3; in no acute distress.  Head: NCAT Lungs: normal respiratory effort Heart: Radial pulses 2+, cap refill <2 seconds Musculoskeletal: Right wrist Inspection: Skin warm, dry, clear and intact without obvious erythema, or ecchymosis. Mild effusion over the lateral distal radius Palpation: TTP over distal radius and snuff box ROM: LROM about the wrist; discomfort increased with wrist flexion and extension Strength: 5/5 elbow flexion, 5/5 elbow extension,  5/5 wrist flexion/ extension, 4+/5 grip strength Skin: warm and dry Neurologic: Ambulates without difficulty; Sensation  intact about the upper extremities Psychological: alert and cooperative; normal mood and affect   DIAGNOSTIC STUDIES:  Dg Wrist Complete Right  Result Date: 01/14/2019 CLINICAL DATA:  Right wrist pain and swelling, no known injury, initial encounter EXAM: RIGHT WRIST - COMPLETE 3+ VIEW COMPARISON:  None. FINDINGS: There is no evidence of fracture or dislocation. There is no evidence of arthropathy or other focal bone abnormality. Soft tissues are unremarkable. IMPRESSION: No acute abnormality noted. Electronically Signed   By: Alcide Clever M.D.   On: 01/14/2019 11:44     ASSESSMENT & PLAN:  1. Right wrist pain   2. Tendinitis, de Quervain's    Meds ordered this encounter  Medications  . naproxen (NAPROSYN) 375 MG tablet    Sig: Take 1 tablet (375 mg total) by mouth 2 (two) times daily.    Dispense:  20 tablet    Refill:  0    Order Specific Question:   Supervising Provider    Answer:   Eustace Moore [[id number]]   X-rays did not show show fracture or dislocation Thumb spica place.  Continue conservative management of rest, ice, elevation, and compresion Take naproxen as needed for pain relief (may cause abdominal discomfort, ulcers, and GI bleeds avoid taking with other NSAIDs) Work restrictions given Follow up with PCP or with orthopedist if symptoms persist Return or go to the ER if you have any new or worsening symptoms (fever, chills, chest pain, abdominal pain, changes in bowel or bladder habits, pain radiating into lower legs, etc...)   Reviewed expectations re: course of current medical issues. Questions answered. Outlined signs and symptoms indicating need for more acute intervention. Patient verbalized understanding. After Visit Summary given.    Rennis Harding, PA-C 01/14/19 1243

## 2019-01-14 NOTE — ED Triage Notes (Signed)
Pt sts right wrist pain and swelling starting yesterday

## 2020-04-02 ENCOUNTER — Ambulatory Visit: Payer: Self-pay | Attending: Internal Medicine

## 2020-04-02 DIAGNOSIS — Z23 Encounter for immunization: Secondary | ICD-10-CM

## 2020-04-02 NOTE — Progress Notes (Signed)
   Covid-19 Vaccination Clinic  Name:  Duane Torres    MRN: [id number] DOB: Sep 28, 1966  04/02/2020  [name] was observed post Covid-19 immunization for 15 minutes without incident. He was provided with Vaccine Information Sheet and instruction to access the V-Safe system.   [name] was instructed to call 911 with any severe reactions post vaccine: Marland Kitchen Difficulty breathing  . Swelling of face and throat  . A fast heartbeat  . A bad rash all over body  . Dizziness and weakness   Immunizations Administered    Name Date Dose VIS Date Route   Pfizer COVID-19 Vaccine 04/02/2020 10:51 AM 0.3 mL 12/04/2019 Intramuscular   Manufacturer: [employer], [employer]   Lot: [id number]   NDC: [address]-6

## 2020-04-14 ENCOUNTER — Other Ambulatory Visit: Payer: Self-pay

## 2020-04-14 ENCOUNTER — Encounter (HOSPITAL_COMMUNITY): Payer: Self-pay

## 2020-04-14 ENCOUNTER — Ambulatory Visit (HOSPITAL_COMMUNITY)
Admission: EM | Admit: 2020-04-14 | Discharge: 2020-04-14 | Disposition: A | Discharge status: Home / Self Care | Payer: Self-pay | Attending: Family Medicine | Admitting: Family Medicine

## 2020-04-14 DIAGNOSIS — M2391 Unspecified internal derangement of right knee: Secondary | ICD-10-CM

## 2020-04-14 NOTE — Discharge Instructions (Signed)
Believe this is a meniscal tear.  We will place you in a knee brace and have you see orthopedics.  Rest, ice the knee Ibuprofen 600 mg every 8 hours as needed.

## 2020-04-14 NOTE — ED Triage Notes (Signed)
Pt presents to UC with pain and swelling in his right knee x 2 days. Pt reports having a bump in the back of his right knee x 2 days. Pain is worse when walking. Pt is using cold compress with somewhat relief.

## 2020-04-14 NOTE — ED Provider Notes (Signed)
[facility]    CSN: [id number] Arrival date & time: 04/14/20  1144      History   Chief Complaint Chief Complaint  Patient presents with  . Knee Problem    HPI Duane Torres is a 54 y.o. male.   Patient is a 54 year old male presents today with right knee pain and swelling.  Symptoms have been constant x2 days.  Started after sitting over a baby gate and feeling a pop in the medial part of the right knee.  Since he has had medial pain, swelling and posterior swelling.  He has been icing the knee and resting without much change.  Denies any numbness, tingling.  Pain with ambulation.  ROS per HPI      Past Medical History:  Diagnosis Date  . Elevated blood pressure reading without diagnosis of hypertension   . Family history of cardiovascular disease   . Family history of diabetes mellitus   . Hematuria   . Pilonidal cyst   . Tobacco abuse     Patient Active Problem List   Diagnosis Date Noted  . Pilonidal cyst   . Tobacco abuse   . Elevated blood pressure reading without diagnosis of hypertension   . Hematuria   . Family history of diabetes mellitus   . Family history of cardiovascular disease     Past Surgical History:  Procedure Laterality Date  . TONSILLECTOMY AND ADENOIDECTOMY         Home Medications    Prior to Admission medications   Medication Sig Start Date End Date Taking? Authorizing Provider  lisinopril (PRINIVIL,ZESTRIL) 10 MG tablet Take 1 tablet (10 mg total) by mouth daily. 09/14/15   Rosalio Macadamia, NP  naproxen (NAPROSYN) 375 MG tablet Take 1 tablet (375 mg total) by mouth 2 (two) times daily. 01/14/19   Rennis Harding, PA-C    Family History Family History  Problem Relation Age of Onset  . Diabetes Mother   . Heart failure Mother   . Hypertension Mother   . Stroke Mother   . Breast cancer Mother   . Diabetes Father   . Hypertension Father   . Heart attack Brother   . Diabetes Brother     Social History Social  History   Tobacco Use  . Smoking status: Current Every Day Smoker    Packs/day: 0.50    Years: 25.00    Pack years: 12.50    Types: Cigarettes  . Smokeless tobacco: Never Used  Substance Use Topics  . Alcohol use: Yes    Alcohol/week: 0.0 standard drinks  . Drug use: No    Comment: USED IN THE PASS A FEW TIMES     Allergies   Patient has no known allergies.   Review of Systems Review of Systems   Physical Exam Triage Vital Signs ED Triage Vitals  Enc Vitals Group     BP 04/14/20 1225 (!) 156/91     Pulse Rate 04/14/20 1225 93     Resp 04/14/20 1225 18     Temp 04/14/20 1225 98.6 F (37 C)     Temp Source 04/14/20 1225 Oral     SpO2 04/14/20 1225 97 %     Weight --      Height --      Head Circumference --      Peak Flow --      Pain Score 04/14/20 1223 5     Pain Loc --      Pain Edu? --  Excl. in GC? --    No data found.  Updated Vital Signs BP (!) 156/91 (BP Location: Right Arm)   Pulse 93   Temp 98.6 F (37 C) (Oral)   Resp 18   SpO2 97%   Visual Acuity Right Eye Distance:   Left Eye Distance:   Bilateral Distance:    Right Eye Near:   Left Eye Near:    Bilateral Near:     Physical Exam Vitals and nursing note reviewed.  Constitutional:      Appearance: Normal appearance.  HENT:     Head: Normocephalic and atraumatic.     Nose: Nose normal.  Eyes:     Conjunctiva/sclera: Conjunctivae normal.  Pulmonary:     Effort: Pulmonary effort is normal.  Musculoskeletal:        General: Normal range of motion.     Cervical back: Normal range of motion.       Legs:     Comments: TTP of the right knee medial with swelling.  Posterior swelling but no pain.  Limited flexion.  No laxity.  No erythema.   Skin:    General: Skin is warm and dry.  Neurological:     Mental Status: He is alert.  Psychiatric:        Mood and Affect: Mood normal.        Behavior: Behavior normal.      UC Treatments / Results  Labs (all labs ordered are  listed, but only abnormal results are displayed) Labs Reviewed - No data to display  EKG   Radiology No results found.  Procedures Procedures (including critical care time)  Medications Ordered in UC Medications - No data to display  Initial Impression / Assessment and Plan / UC Course  I have reviewed the triage vital signs and the nursing notes.  Pertinent labs & imaging results that were available during my care of the patient were reviewed by me and considered in my medical decision making (see chart for details).     Internal derangement of the knee Most likely meniscus tear.  Placing in knee brace Rest, Ice, Elevate and ibuprofen as needed for pain.  Recommended call orthopedics today for follow up. Gave 2 contacts.                 Final Clinical Impressions(s) / UC Diagnoses   Final diagnoses:  Internal derangement of right knee     Discharge Instructions     Believe this is a meniscal tear.  We will place you in a knee brace and have you see orthopedics.  Rest, ice the knee Ibuprofen 600 mg every 8 hours as needed.       ED Prescriptions    None     PDMP not reviewed this encounter.   Orvan July, NP 04/14/20 1339

## 2020-04-26 ENCOUNTER — Ambulatory Visit: Payer: Self-pay

## 2020-04-30 ENCOUNTER — Ambulatory Visit: Payer: Self-pay | Attending: Internal Medicine

## 2020-04-30 DIAGNOSIS — Z23 Encounter for immunization: Secondary | ICD-10-CM

## 2020-04-30 NOTE — Progress Notes (Signed)
   Covid-19 Vaccination Clinic  Name:  Duane Torres    MRN: [id number] DOB: 1966-03-22  04/30/2020  Duane Torres was observed post Covid-19 immunization for 15 minutes without incident. He was provided with Vaccine Information Sheet and instruction to access the V-Safe system.   Duane Torres was instructed to call 911 with any severe reactions post vaccine: Marland Kitchen Difficulty breathing  . Swelling of face and throat  . A fast heartbeat  . A bad rash all over body  . Dizziness and weakness   Immunizations Administered    Name Date Dose VIS Date Route   Pfizer COVID-19 Vaccine 04/30/2020 11:29 AM 0.3 mL 02/17/2019 Intramuscular   Manufacturer: [employer], [employer]   Lot: [id number]   NDC: [address]-7

## 2020-07-02 ENCOUNTER — Other Ambulatory Visit: Payer: Self-pay

## 2020-07-02 ENCOUNTER — Ambulatory Visit (HOSPITAL_COMMUNITY)
Admission: EM | Admit: 2020-07-02 | Discharge: 2020-07-02 | Disposition: A | Discharge status: Home / Self Care | Payer: Self-pay | Attending: Emergency Medicine | Admitting: Emergency Medicine

## 2020-07-02 ENCOUNTER — Encounter (HOSPITAL_COMMUNITY): Payer: Self-pay

## 2020-07-02 DIAGNOSIS — R102 Pelvic and perineal pain: Secondary | ICD-10-CM

## 2020-07-02 DIAGNOSIS — Z760 Encounter for issue of repeat prescription: Secondary | ICD-10-CM

## 2020-07-02 DIAGNOSIS — I10 Essential (primary) hypertension: Secondary | ICD-10-CM

## 2020-07-02 MED ORDER — LISINOPRIL 10 MG PO TABS: 10.0000 mg | ORAL_TABLET | Freq: Every day | ORAL | 0 refills | Status: AC

## 2020-07-02 NOTE — Discharge Instructions (Signed)
I do not feel an obvious hernia today. That does not mean there is not a small hernia present however.  This may also be more simply a strained muscle.  I would like you to limit straining or physical activity for the next few days, and then limit heavy lifting for the next 10 days to see how your symptoms progress or improve.  You will likely need an ultrasound of the region if the symptoms persist or worsen in any way.  Please go to the ER for any severe pain, persistent swelling or firm nodule to the area for immediate evaluation.  Follow up with your PCP as able for ultrasound to evaluate and for BP management.

## 2020-07-02 NOTE — ED Provider Notes (Signed)
[facility]    CSN: [id number] Arrival date & time: 07/02/20  1104      History   Chief Complaint Chief Complaint  Patient presents with   Abdominal Pain    HPI Duane Torres is a 54 y.o. male.   Duane Torres presents with complaints of discomfort to right lower abdomen/ pelvis. Yesterday afternoon he was working in his attic moving items and felt a "twinge" to the area. He felt like it swelled. States it wasn't painful, but a discomfort and "twinge" which he continued to feel intermittently with certain movements. Swelling has resolved but still can feel the sensation with certain movements. He works a very physical job so is concerned about hernia presence or exacerbating the injury by working. Denies any previous similar. Currently uninsured which is limiting his financial ability to follow up with his PCP. Out of his lisinopril.     ROS per HPI, negative if not otherwise mentioned.      Past Medical History:  Diagnosis Date   Elevated blood pressure reading without diagnosis of hypertension    Family history of cardiovascular disease    Family history of diabetes mellitus    Hematuria    Pilonidal cyst    Tobacco abuse     Patient Active Problem List   Diagnosis Date Noted   Pilonidal cyst    Tobacco abuse    Elevated blood pressure reading without diagnosis of hypertension    Hematuria    Family history of diabetes mellitus    Family history of cardiovascular disease     Past Surgical History:  Procedure Laterality Date   TONSILLECTOMY AND ADENOIDECTOMY         Home Medications    Prior to Admission medications   Medication Sig Start Date End Date Taking? Authorizing Provider  lisinopril (ZESTRIL) 10 MG tablet Take 1 tablet (10 mg total) by mouth daily. 07/02/20 09/30/20  Georgetta Haber, NP  naproxen (NAPROSYN) 375 MG tablet Take 1 tablet (375 mg total) by mouth 2 (two) times daily. 01/14/19   Rennis Harding, PA-C     Family History Family History  Problem Relation Age of Onset   Diabetes Mother    Heart failure Mother    Hypertension Mother    Stroke Mother    Breast cancer Mother    Diabetes Father    Hypertension Father    Heart attack Brother    Diabetes Brother     Social History Social History   Tobacco Use   Smoking status: Current Every Day Smoker    Packs/day: 0.50    Years: 25.00    Pack years: 12.50    Types: Cigarettes   Smokeless tobacco: Never Used  Substance Use Topics   Alcohol use: Yes    Alcohol/week: 0.0 standard drinks   Drug use: No    Comment: USED IN THE PASS A FEW TIMES     Allergies   Patient has no known allergies.   Review of Systems Review of Systems   Physical Exam Triage Vital Signs ED Triage Vitals  Enc Vitals Group     BP 07/02/20 1226 (!) 150/83     Pulse Rate 07/02/20 1226 85     Resp 07/02/20 1226 18     Temp 07/02/20 1226 98.6 F (37 C)     Temp Source 07/02/20 1226 Oral     SpO2 07/02/20 1226 98 %     Weight --      Height --  Head Circumference --      Peak Flow --      Pain Score 07/02/20 1225 0     Pain Loc --      Pain Edu? --      Excl. in GC? --    No data found.  Updated Vital Signs BP (!) 150/83 (BP Location: Right Arm)    Pulse 85    Temp 98.6 F (37 C) (Oral)    Resp 18    SpO2 98%   Visual Acuity Right Eye Distance:   Left Eye Distance:   Bilateral Distance:    Right Eye Near:   Left Eye Near:    Bilateral Near:     Physical Exam Constitutional:      Appearance: He is well-developed.  Cardiovascular:     Rate and Rhythm: Normal rate.  Pulmonary:     Effort: Pulmonary effort is normal.  Abdominal:       Comments: Right inguinal region with very specific point tenderness without palpable hernia presence, however. No redness, warmth, bulging or firmness; full ROM of trunk and torso   Skin:    General: Skin is warm and dry.  Neurological:     Mental Status: He is alert and  oriented to person, place, and time.      UC Treatments / Results  Labs (all labs ordered are listed, but only abnormal results are displayed) Labs Reviewed - No data to display  EKG   Radiology No results found.  Procedures Procedures (including critical care time)  Medications Ordered in UC Medications - No data to display  Initial Impression / Assessment and Plan / UC Course  I have reviewed the triage vital signs and the nursing notes.  Pertinent labs & imaging results that were available during my care of the patient were reviewed by me and considered in my medical decision making (see chart for details).     Inguinal hernia vs muscle strain discussed and considered. No emergent incarcerated hernia findings on exam. Patient uninsured and discusses limitation in follow up for imaging due to this. Rest and limited lifting for the next two weeks to see how symptoms are, follow up for imaging as able and strict return precautions discussed. Lisinopril provided x90 days, should be insured by October. Patient verbalized understanding and agreeable to plan.   Final Clinical Impressions(s) / UC Diagnoses   Final diagnoses:  Pelvic pain in male  Medication refill  Hypertension, unspecified type     Discharge Instructions     I do not feel an obvious hernia today. That does not mean there is not a small hernia present however.  This may also be more simply a strained muscle.  I would like you to limit straining or physical activity for the next few days, and then limit heavy lifting for the next 10 days to see how your symptoms progress or improve.  You will likely need an ultrasound of the region if the symptoms persist or worsen in any way.  Please go to the ER for any severe pain, persistent swelling or firm nodule to the area for immediate evaluation.  Follow up with your PCP as able for ultrasound to evaluate and for BP management.    ED Prescriptions    Medication  Sig Dispense Auth. Provider   lisinopril (ZESTRIL) 10 MG tablet Take 1 tablet (10 mg total) by mouth daily. 90 tablet Georgetta Haber, NP     PDMP not reviewed this encounter.  Georgetta Haber, NP 07/02/20 1448

## 2020-07-02 NOTE — ED Triage Notes (Signed)
Pt c/o a tight/tense feeling to RLQ after moving objects yesterday. Pt reports swelling to area after lifting/moving items and states swelling has improved.  Denies fever, chills, n/v/d; n/v intact to RLE.  Pt states he needs refill of lisinopril; has not been taking Rx 2/2 financial strain.

## 2020-07-19 ENCOUNTER — Ambulatory Visit (HOSPITAL_COMMUNITY)
Admission: EM | Admit: 2020-07-19 | Discharge: 2020-07-19 | Disposition: A | Discharge status: Home Health | Payer: Self-pay | Attending: Family Medicine | Admitting: Family Medicine

## 2020-07-19 ENCOUNTER — Other Ambulatory Visit: Payer: Self-pay

## 2020-07-19 ENCOUNTER — Encounter (HOSPITAL_COMMUNITY): Payer: Self-pay

## 2020-07-19 DIAGNOSIS — Z23 Encounter for immunization: Secondary | ICD-10-CM

## 2020-07-19 DIAGNOSIS — S51832A Puncture wound without foreign body of left forearm, initial encounter: Secondary | ICD-10-CM

## 2020-07-19 MED ORDER — TETANUS-DIPHTH-ACELL PERTUSSIS 5-2.5-18.5 LF-MCG/0.5 IM SUSP
0.5000 mL | Freq: Once | INTRAMUSCULAR | Status: AC
  Administered 2020-07-19: 0.5 mL via INTRAMUSCULAR

## 2020-07-19 MED ORDER — TETANUS-DIPHTH-ACELL PERTUSSIS 5-2.5-18.5 LF-MCG/0.5 IM SUSP
INTRAMUSCULAR | Status: AC
  Filled 2020-07-19: qty 0.5

## 2020-07-19 NOTE — ED Provider Notes (Signed)
  [facility]   [id number] 07/19/20 Arrival Time: 1301  ASSESSMENT & PLAN:  1. Puncture wound of left forearm, initial encounter     Keep clean and dry. Watch for s/s of infection. Td updated.    Discharge Instructions      If not allergic, you may use over the counter ibuprofen or acetaminophen as needed.   Meds ordered this encounter  Medications  . Tdap (BOOSTRIX) injection 0.5 mL      Reviewed expectations re: course of current medical issues. Questions answered. Outlined signs and symptoms indicating need for more acute intervention. Patient verbalized understanding. After Visit Summary given.   SUBJECTIVE:  Duane Torres is a 54 y.o. male who presents with a small puncture wound to L forearm; today; tip of fence vs forearm. Significant bleeding. Held under running water at home. Is painful. No extremity sensation changes or weakness.   Td UTD: No.   OBJECTIVE:  Vitals:   07/19/20 1451  BP: (!) 153/104  Pulse: 70  Resp: 16  Temp: 97.8 F (36.6 C)  SpO2: 98%     General appearance: alert; no distress LUE: L forearm with approx 0.58mm puncture wound; no active bleeding; mild surrounding erythema; is TTP; distal sensation intact Psychological: alert and cooperative; normal mood and affect   No Known Allergies  Past Medical History:  Diagnosis Date  . Elevated blood pressure reading without diagnosis of hypertension   . Family history of cardiovascular disease   . Family history of diabetes mellitus   . Hematuria   . Pilonidal cyst   . Tobacco abuse    Social History   Socioeconomic History  . Marital status: Married    Spouse name: Not on file  . Number of children: 0  . Years of education: Not on file  . Highest education level: Not on file  Occupational History  . Occupation: [employer]  Tobacco Use  . Smoking status: Current Every Day Smoker    Packs/day: 0.50    Years: 25.00    Pack years: 12.50    Types:  Cigarettes  . Smokeless tobacco: Never Used  Substance and Sexual Activity  . Alcohol use: Yes    Alcohol/week: 0.0 standard drinks  . Drug use: No    Comment: USED IN THE PASS A FEW TIMES  . Sexual activity: Yes  Other Topics Concern  . Not on file  Social History Narrative  . Not on file   Social Determinants of Health   Financial Resource Strain:   . Difficulty of Paying Living Expenses:   Food Insecurity:   . Worried About Programme researcher, broadcasting/film/video in the Last Year:   . Barista in the Last Year:   Transportation Needs:   . Freight forwarder (Medical):   Marland Kitchen Lack of Transportation (Non-Medical):   Physical Activity:   . Days of Exercise per Week:   . Minutes of Exercise per Session:   Stress:   . Feeling of Stress :   Social Connections:   . Frequency of Communication with Friends and Family:   . Frequency of Social Gatherings with Friends and Family:   . Attends Religious Services:   . Active Member of Clubs or Organizations:   . Attends Banker Meetings:   Marland Kitchen Marital Status:          Mardella Layman, MD 07/19/20 1535

## 2020-07-19 NOTE — ED Triage Notes (Signed)
Pt c/o laceration to left forearm from working on fence, bleeding controlled with bandage applied at home, unknown last tetanus

## 2020-07-19 NOTE — Discharge Instructions (Addendum)
If not allergic, you may use over the counter ibuprofen or acetaminophen as needed.   Meds ordered this encounter  Medications   Tdap (BOOSTRIX) injection 0.5 mL

## 2021-09-18 ENCOUNTER — Encounter (HOSPITAL_COMMUNITY): Payer: Self-pay | Admitting: Emergency Medicine

## 2021-09-18 ENCOUNTER — Other Ambulatory Visit: Payer: Self-pay

## 2021-09-18 ENCOUNTER — Ambulatory Visit (HOSPITAL_COMMUNITY)
Admission: EM | Admit: 2021-09-18 | Discharge: 2021-09-18 | Disposition: A | Discharge status: Home / Self Care | Payer: Self-pay | Attending: Emergency Medicine | Admitting: Emergency Medicine

## 2021-09-18 ENCOUNTER — Ambulatory Visit (INDEPENDENT_AMBULATORY_CARE_PROVIDER_SITE_OTHER): Payer: Self-pay

## 2021-09-18 DIAGNOSIS — S8011XA Contusion of right lower leg, initial encounter: Secondary | ICD-10-CM

## 2021-09-18 DIAGNOSIS — W278XXA Contact with other nonpowered hand tool, initial encounter: Secondary | ICD-10-CM

## 2021-09-18 DIAGNOSIS — M79661 Pain in right lower leg: Secondary | ICD-10-CM

## 2021-09-18 MED ORDER — IBUPROFEN 800 MG PO TABS
ORAL_TABLET | ORAL | Status: AC
  Filled 2021-09-18: qty 1

## 2021-09-18 MED ORDER — IBUPROFEN 800 MG PO TABS: 800.0000 mg | ORAL_TABLET | Freq: Three times a day (TID) | ORAL | 0 refills | Status: AC

## 2021-09-18 MED ORDER — IBUPROFEN 800 MG PO TABS
800.0000 mg | ORAL_TABLET | Freq: Once | ORAL | Status: AC
  Administered 2021-09-18: 800 mg via ORAL
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
## 2021-09-18 NOTE — Discharge Instructions (Signed)
X-ray normal Ice and elevate leg Tylenol and ibuprofen for pain and swelling Please keep wound clean and dry Please follow-up if not improving or worsening
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
